# Patient Record
Sex: Female | Born: 1945 | Hispanic: No | Marital: Married | Smoking: Light tobacco smoker
Health system: Southern US, Community
[De-identification: ages and names within clinical notes are randomized; demographics above are authoritative.]

## PROBLEM LIST (undated history)

## (undated) DIAGNOSIS — Z5189 Encounter for other specified aftercare: Secondary | ICD-10-CM

## (undated) DIAGNOSIS — F99 Mental disorder, not otherwise specified: Secondary | ICD-10-CM

## (undated) DIAGNOSIS — I999 Unspecified disorder of circulatory system: Secondary | ICD-10-CM

## (undated) DIAGNOSIS — F419 Anxiety disorder, unspecified: Secondary | ICD-10-CM

## (undated) DIAGNOSIS — M1990 Unspecified osteoarthritis, unspecified site: Secondary | ICD-10-CM

## (undated) DIAGNOSIS — H269 Unspecified cataract: Secondary | ICD-10-CM

## (undated) DIAGNOSIS — J449 Chronic obstructive pulmonary disease, unspecified: Secondary | ICD-10-CM

## (undated) DIAGNOSIS — D689 Coagulation defect, unspecified: Secondary | ICD-10-CM

## (undated) HISTORY — DX: Encounter for other specified aftercare: Z51.89

## (undated) HISTORY — DX: Mental disorder, not otherwise specified: F99

## (undated) HISTORY — PX: ESOPHAGUS SURGERY: SHX626

## (undated) HISTORY — DX: Unspecified cataract: H26.9

## (undated) HISTORY — DX: Anxiety disorder, unspecified: F41.9

## (undated) HISTORY — DX: Chronic obstructive pulmonary disease, unspecified: J44.9

## (undated) HISTORY — DX: Unspecified osteoarthritis, unspecified site: M19.90

## (undated) HISTORY — PX: TUBAL LIGATION: SHX77

## (undated) HISTORY — PX: LEG WOUND REPAIR / CLOSURE: SUR1143

## (undated) HISTORY — DX: Coagulation defect, unspecified: D68.9

## (undated) HISTORY — DX: Unspecified disorder of circulatory system: I99.9

---

## 2012-07-11 ENCOUNTER — Ambulatory Visit (HOSPITAL_COMMUNITY): Payer: Self-pay | Admitting: Psychiatry

## 2012-11-11 ENCOUNTER — Ambulatory Visit (INDEPENDENT_AMBULATORY_CARE_PROVIDER_SITE_OTHER): Payer: Medicare Other

## 2012-11-11 ENCOUNTER — Other Ambulatory Visit: Payer: Self-pay | Admitting: Family Medicine

## 2012-11-11 DIAGNOSIS — M810 Age-related osteoporosis without current pathological fracture: Secondary | ICD-10-CM

## 2012-11-11 DIAGNOSIS — Z780 Asymptomatic menopausal state: Secondary | ICD-10-CM

## 2012-11-11 DIAGNOSIS — Z1231 Encounter for screening mammogram for malignant neoplasm of breast: Secondary | ICD-10-CM

## 2013-11-01 IMAGING — DX DG DXA BONE DENSITY STUDY HL7
2 series · 2 of 2 positions shown · non-contrast
Comparison: None

CLINICAL DATA: Post menopausal osteoporosis screening. The patient
is on current bone [HOSPITAL] with [facility].

[view not recorded (1 of 2)]
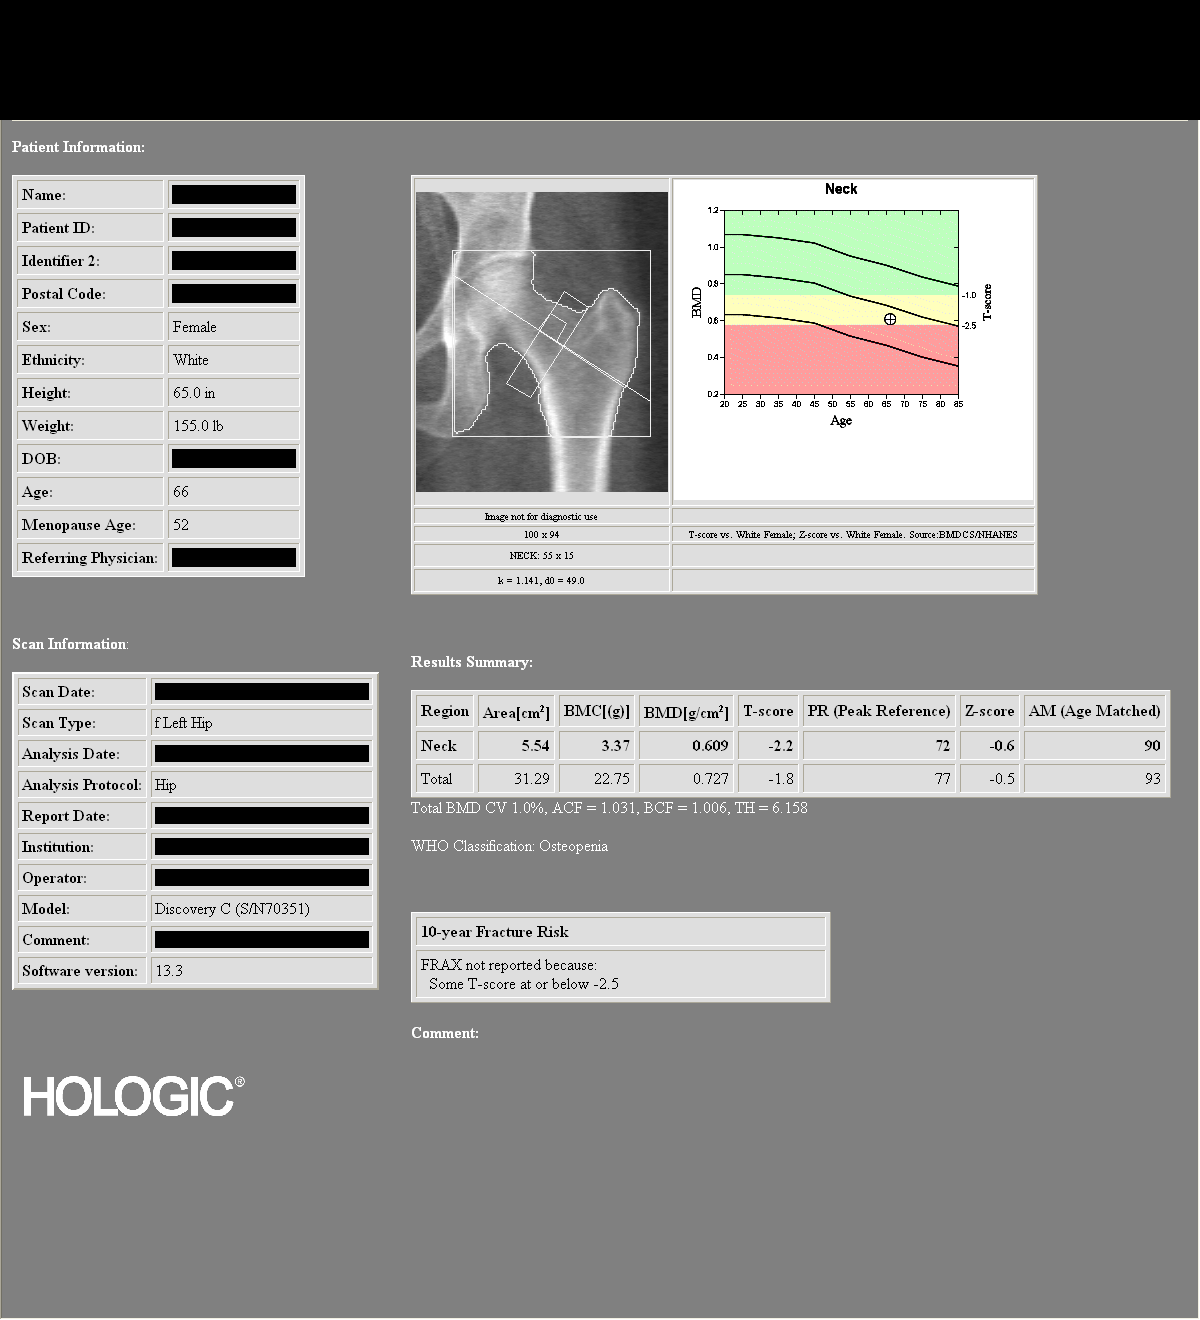

[view not recorded (2 of 2)]
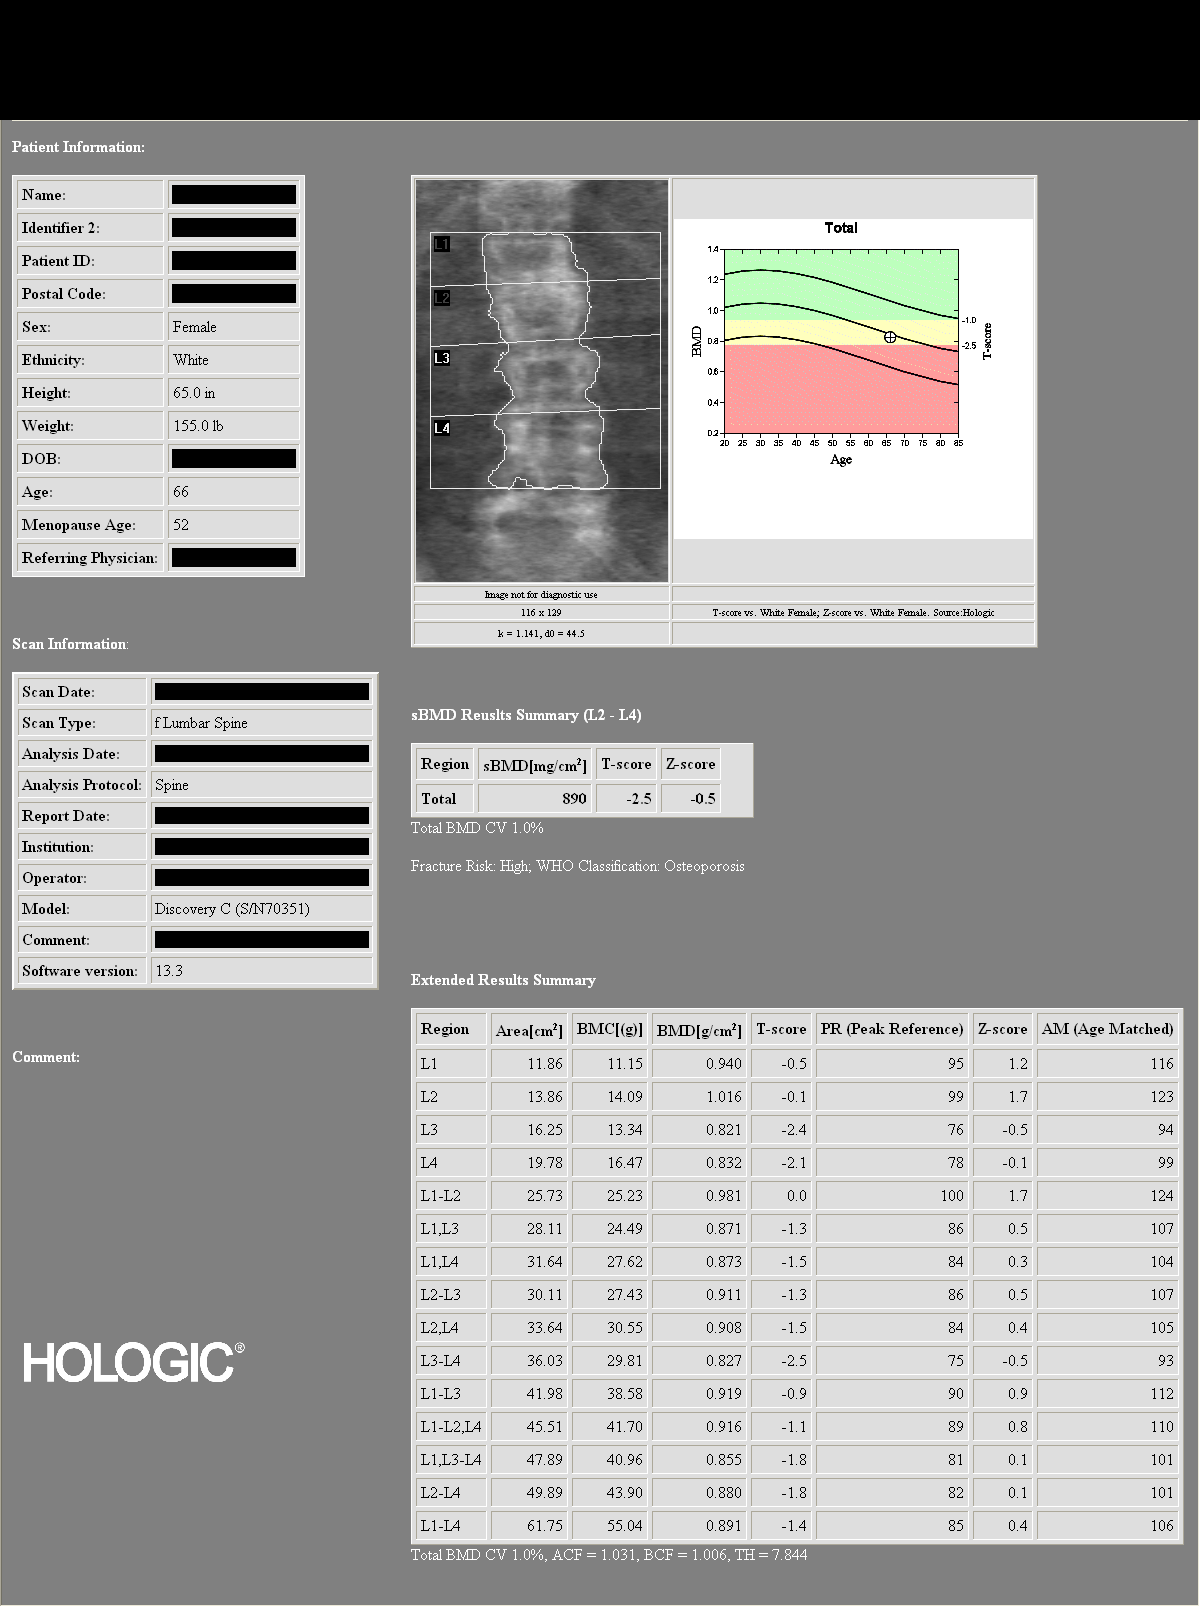

[2 of 2 positions shown; findings below may reference images not displayed]

DUAL X-RAY ABSORPTIOMETRY (DXA) FOR BONE MINERAL DENSITY

AP LUMBAR SPINE L3 AND L4

Bone Mineral Density (BMD):            0.827 g/cm2
Young Adult T Score:                          -2.5
Z Score:                                                -0.5

LEFT FEMUR NECK

Bone Mineral Density (BMD):             0.609 g/cm2
Young Adult T Score:                           -2.2
Z Score:                                                 -0.6

ASSESSMENT:  Patient's diagnostic category is OSTEOPOROSIS by WHO
Criteria.

FRACTURE RISK: INCREASED

FRAX: Not calculated due to T score at or below -2.5 as well as
current bone [HOSPITAL] with [facility].
RECOMMENDATIONS:

Effective therapies are available in the form of bisphosphonates,
selective estrogen receptor modulators, biologic agents, and
hormone replacement therapy (for women).  All patients should
ensure an adequate intake of dietary calcium (1200mg daily) and
vitamin D ([address]) unless contraindicated.

All treatment decisions require clinical judgement and
consideration of individual patient factors, including patient
preferences, co-morbidities, previous drug use, risk factors not
captured in the FRAX model (e.g., frailty, falls, vitamin D
deficiency, increased bone turnover, interval significant decline
in bone density) and possible under-or over-estimation of fracture
risk by FRAX.

The National Osteoporosis Foundation recommends that FDA-approved
medical therapies be considered in postmenopausal women and mean
age 50 or older with a:

      1)     Hip or vertebral (clinical or morphometric) fracture.

2)    T-score of -2.5 or lower at the spine or hip.
3)    Ten-year fracture probability by FRAX of 3% or greater for
hip fracture or 20% or greater for major osteoporotic fracture.
FOLLOW-UP:

People with diagnosed cases of osteoporosis or at high risk for
fracture should have regular bone mineral density tests.  For
patients eligible for Medicare, routine testing is allowed once
every 2 years.  The testing frequency can be increased to one year
for patients who have rapidly progressing disease, those who are
receiving or discontinuing medical therapy to restore bone mass, or
have additional risk factors.

World Health Organization (WHO) Criteria:

Normal: T scores from +1.0 to -1.0
Low Bone Mass (Osteopenia): T scores between -1.0 and -2.5
Osteoporosis: T scores -2.5 and below

Comparison to Reference Population:

T score is the key measure used in the diagnosis of osteoporosis
and relative risk determination for fracture.  It provides a value
for bone mass relative to the mean bone mass of a young adult
reference population expressed in terms of standard deviation (SD).

Z score is the age-matched score showing the patient's values
compared to a population matched for age, sex, and race.  This is
also expressed in terms of standard deviation.  The patient may
have values that compare favorably to the age-matched values and
still be at increased risk for fracture.

## 2013-11-30 ENCOUNTER — Ambulatory Visit (HOSPITAL_COMMUNITY): Payer: Self-pay | Admitting: Psychiatry

## 2014-01-15 ENCOUNTER — Ambulatory Visit (HOSPITAL_COMMUNITY): Payer: Self-pay | Admitting: Psychiatry

## 2016-08-06 ENCOUNTER — Telehealth (HOSPITAL_COMMUNITY): Payer: Self-pay

## 2016-08-06 NOTE — Telephone Encounter (Signed)
Called to schedule consult for compression fracture. Pt stated that she is already going to see Dr. Manson PasseyBrown for her back. She will give us a call if things change. AW

## 2017-10-15 NOTE — Progress Notes (Signed)
[facility] - Clinic Note  10/18/2017     CHIEF COMPLAINT Patient presents for Retina Evaluation   HISTORY OF PRESENT ILLNESS: Laura JarvisCarol Mun is a 71 y.o. female who presents to the clinic today for:   HPI    Retina Evaluation  In both eyes.  This started years ago.  Duration of hours.  Associated Symptoms Photophobia, Flashes and Pain.  Negative for Blind Spot, Floaters, Glare, Distortion, Redness, Trauma, Shoulder/Hip pain, Fatigue, Weight Loss, Jaw Claudication, Scalp Tenderness and Fever.  Context:  distance vision, night driving, mid-range vision and near vision.  Treatments tried include eye drops.  Response to treatment was no improvement.  I, the attending physician,  performed the HPI with the patient and updated documentation appropriately.        Comments  Referral of DR. Hecker Evaluation of possible fluid OU. Patient states she has always been sensitive to light even as a child  ou . She states when she drives at night she sees circles around objects OU .Her eyes feel tried and sore Ou.Dr. Elmer PickerHecker told patient she has cataracts OU."Right eye hurts and burns all the time " She uses Smooth XP 1 gtts BID,  Visine PRN, Systane 4-5 x QD. Vitamin C, D, Zinc,mulitvitamin B-12 .    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 (History)      Referring physician: Mateo FlowHecker, Kathryn, MD [address] [place], [place] [id number]  HISTORICAL INFORMATION:   Selected notes from the MEDICAL RECORD NUMBER Referral from Dr. Kirtland BouchardK. Hecker for concern of PED  OD; Ocular Hx- cataract OU; punct plugs OU (Dr. August LuzGarber, [phone]'s);  PMH- high chol.;    CURRENT MEDICATIONS: No current outpatient prescriptions on file. (Ophthalmic Drugs)   No current facility-administered medications for this visit.  (Ophthalmic Drugs)   Current Outpatient Prescriptions (Other)  Medication Sig  . albuterol (PROVENTIL HFA;VENTOLIN HFA) 108 (90 Base) MCG/ACT inhaler Inhale into the lungs.  Marland Kitchen. apixaban (ELIQUIS) 5 MG TABS tablet Take by mouth.  . bacitracin 500 UNIT/GM ointment Apply topically.  . baclofen (LIORESAL) 10 MG tablet Take by mouth.  . Biotin 2500 MCG CAPS Take by mouth.  . bumetanide (BUMEX) 2 MG tablet Take 2 mg by mouth 2 (two) times daily.  . bumetanide (BUMEX) 2 MG tablet TAKE 1 TABLET TWICE A DAY  . Calcium-Vitamin D-Vitamin K 500-100-40 MG-UNT-MCG CHEW Chew by mouth.  . clonazePAM (KLONOPIN) 1 MG tablet TAKE 1 TABLET TWICE DAILY AS NEEDED FOR ANXIETY  . cyanocobalamin (,VITAMIN B-12,) 1000 MCG/ML injection Inject as directed every 30 (thirty) days.  . diclofenac sodium (VOLTAREN) 1 % GEL APPLY 2 GRAMS TO AFFECTED AREA(S) TWICE DAILY AS NEEDED (2 GRAMS IS EQUAL TO A NICKEL SIZED AMOUNT)  . ELIQUIS 5 MG TABS tablet TAKE ONE TABLET (5 MG TOTAL) BY MOUTH 2 (TWO) TIMES DAILY.  Marland Kitchen. EPINEPHrine 0.3 mg/0.3 mL IJ SOAJ injection as needed.  Marland Kitchen. FLUoxetine (PROZAC) 20 MG capsule Take 60 mg by mouth.  .Marland Kitchen  fluticasone (FLONASE) 50 MCG/ACT nasal spray Place into the nose.  . fluticasone (FLONASE) 50 MCG/ACT nasal spray SPRAY 1 SPRAY INTO EACH NOSTRIL EVERY DAY  . fluticasone (VERAMYST) 27.5 MCG/SPRAY nasal spray Place into the nose.  . folic acid (FOLVITE) 1 MG tablet Take 1 mg by mouth daily.  . hydrOXYzine (ATARAX/VISTARIL) 25 MG tablet TAKE 1 TABLET (25 MG TOTAL) BY MOUTH 3 (THREE) TIMES DAILY AS NEEDED FOR UP TO 10 DAYS FOR ITCHING.  . morphine (MS CONTIN) 15 MG 12 hr tablet Take 15 mg by mouth 2 (two) times daily.  Marland Kitchen omeprazole (PRILOSEC) 40 MG capsule Take 40 mg by  mouth.  . oxyCODONE (OXY IR/ROXICODONE) 5 MG immediate release tablet Take 5 mg by mouth 4 (four) times daily.  Marland Kitchen oxycodone-acetaminophen (LYNOX) 10-300 MG tablet oxycodone  . polyethylene glycol (MIRALAX / GLYCOLAX) packet Take by mouth.  . potassium chloride (MICRO-K) 10 MEQ CR capsule TAKE 7 CAPSULES BY MOUTH EVERY DAY  . senna (SENOKOT) 8.6 MG tablet Take by mouth.  . SYRINGE-NEEDLE, DISP, 3 ML (B-D 3CC LUER-LOK SYR 23GX1") 23G X 1" 3 ML MISC USE FOR INJECTION ONCE WEEKLY.  . clindamycin (CLEOCIN) 300 MG capsule TAKE 1 CAPSULE BY MOUTH EVERY 6 HOURS  . promethazine-dextromethorphan (PROMETHAZINE-DM) 6.25-15 MG/5ML syrup Take 5 mLs by mouth.  . rosuvastatin (CRESTOR) 5 MG tablet   . warfarin (COUMADIN) 2.5 MG tablet TAKE AS DIRECTED (1 TABLET BY MOUTH 5 DAYS PER WEEK, AND 2 TABLETS 2 DAYS PER WEEK)   No current facility-administered medications for this visit.  (Other)      REVIEW OF SYSTEMS: ROS    Positive for: Skin, Genitourinary, Musculoskeletal, Cardiovascular, Eyes, Respiratory, Psychiatric, Heme/Lymph   Negative for: Constitutional, Gastrointestinal, Neurological, HENT, Endocrine, Allergic/Imm   Last edited by Murtis Sink on 10/18/2017  3:51 PM. (History)       ALLERGIES Allergies  Allergen Reactions  . Penicillins Anaphylaxis    PAST MEDICAL HISTORY Past Medical History:  Diagnosis Date  . Anxiety   . Arthritis   . Blood transfusion without reported diagnosis   . Cataract   . Clotting disorder (HCC)   . COPD (chronic obstructive pulmonary disease) (HCC)   . Osteoarthrosis   . Psychiatric disorder   . Vascular disease    Past Surgical History:  Procedure Laterality Date  . ESOPHAGUS SURGERY    . TUBAL LIGATION      FAMILY HISTORY Family History  Problem Relation Age of Onset  . Stroke Mother   . Cirrhosis Father   . Arthritis-Osteo Maternal Aunt     SOCIAL HISTORY Social History  Substance Use Topics  . Smoking status: Light Tobacco  Smoker  . Smokeless tobacco: Current User  . Alcohol use 1.2 oz/week    2 Cans of beer per week         OPHTHALMIC EXAM:  Base Eye Exam    Visual Acuity (Snellen - Linear)      Right Left   Dist cc 20/40 -2 20/50 +1   Dist ph cc 20/40 +1 20/30 -1   Correction:  Glasses       Tonometry (Tonopen, 3:22 PM)      Right Left   Pressure 16 15       Pupils      Dark Light Shape React APD   Right 7 5 Round 3 None   Left 7 5 Round 3 None       Visual Fields (Counting fingers)  Left Right    Full Full       Extraocular Movement      Right Left    Full, Ortho Full, Ortho       Neuro/Psych    Oriented x3:  Yes   Mood/Affect:  Normal       Dilation    Both eyes:  1.0% Mydriacyl, 2.5% Phenylephrine @ 3:22 PM        Slit Lamp and Fundus Exam    Slit Lamp Exam      Right Left   Lids/Lashes Mild Dermatochalasis - upper lid Mild Dermatochalasis - upper lid   Conjunctiva/Sclera White and quiet White and quiet   Cornea Inferior 2+ Punctate epithelial erosions, trace-1+ Guttata Inferior 3+ Punctate epithelial erosions   Anterior Chamber Deep and quiet Deep and quiet   Iris Round and dilated Round and dilated   Lens 2+ Nuclear sclerosis, 2+ Cortical cataract 2+ Nuclear sclerosis, 2+ Cortical cataract   Vitreous Mild Vitreous syneresis Vitreous syneresis       Fundus Exam      Right Left   Disc Normal Normal   C/D Ratio 0.3 0.3   Macula blunted foveal reflex, Retinal pigment epithelial mottling, no heme or edema Flat, Retinal pigment epithelial mottling, no heme or edema   Vessels Normal Normal   Periphery Pigmented Chorioretinal scar at 0900, otherwise attached, No SRF, No detachment PIgment clumping at 0430        Refraction    Wearing Rx      Sphere Cylinder Axis Add   Right [place] +1.00 170 +2.50   Left -0.25 +1.00 180 +2.50   Age:  2   Type:  PAL       Manifest Refraction (Over)      Sphere Cylinder Axis Dist VA   Right +1.00 +0.75 155 20/30   Left  [place] +0.50 008 20/30-1          IMAGING AND PROCEDURES  Imaging and Procedures for 10/19/17  OCT, Retina - OU - Both Eyes     Right Eye Quality was good. Central Foveal Thickness: 339. Progression has no prior data. Findings include abnormal foveal contour, no SRF (Intra-foveal cystic spaces; foveoschisis of OPL).   Left Eye Quality was good. Central Foveal Thickness: 277. Progression has no prior data. Findings include normal foveal contour, no IRF, no SRF.   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 CME - cystoid macular edema. PED - pigment epithelial detachment. IRF - intraretinal fluid. SRF - subretinal fluid. EZ - ellipsoid zone. ERM - epiretinal membrane. ORA - outer retinal atrophy. ORT - outer retinal tubulation. SRHM - subretinal hyper-reflective material       Fluorescein Angiography [place] (Transit OD)     Right Eye Early phase findings include normal observations. Mid/Late phase findings include normal observations.   Left Eye Early phase findings include normal observations. Mid/Late phase findings include normal observations.   Notes OD: no petaloid hyperfluorescence corresponding to cystic changes noted on OCT              ASSESSMENT/PLAN:    ICD-10-CM   1. 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  Combined forms of age-related cataract of both eyes H25.813   4. 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 Retinal edema OD  - cystic changes on OCT appear more like foveoschisis in the outer plexiform layer, typically seen in myopic degeneration, however pt is not myopic; also no VMA or VMT on OCT to indicate tractional  etiology  - on FA -- no leakage into macula corresponding to cystic changes, less suggestive of an inflammatory CME  - VA appears to be minimally affected by cystic changes and no treatment indicated at this time  - no contraindication to cataract extraction OD from a retina standpoint  - will monitor for now  - f/u in 6 weeks  2. CR scar OD  - heavily pigmented linear scar -- possibly healed old retinal break  - asymptomatic, no SRF  - No RT or RD on 360 scleral depressed exam  - Reviewed s/s of RT/RD  - Strict return precautions for any such RT/RD signs/symptoms  - monitor for now -- consider laser retinopexy reinforcement if becomes asymptomatic or changes  3. Age-related cataracts OU  - The symptoms of cataract, surgical options, and treatments and risks were discussed with patient.  - discussed diagnosis and progression  - under the expert care of Dr. Elmer Picker  - clear to proceed with cataract surgery OU from a retinal standpoint  4. DES OU  - recommend artificial tears and lubricating ointment as needed  - management per Dr. Elmer Picker    Ophthalmic Meds Ordered this visit:  No orders of the defined types were placed in this encounter.      Return in about 6 weeks (around 11/29/2017) for Dilated Exam, OCT.  There are no Patient Instructions on file for this visit.   Explained the diagnoses, plan, and follow up with the patient and they expressed understanding.  Patient expressed understanding of the importance of proper follow up care.   Karie Chimera, M.D., Ph.D. Diseases & Surgery of the Retina and Vitreous [facility] 10/19/17     Abbreviations: M myopia (nearsighted); A astigmatism; H hyperopia (farsighted); P presbyopia; Mrx spectacle prescription;  CTL contact lenses; OD right eye; OS left eye; OU both eyes  XT exotropia; ET esotropia; PEK punctate epithelial keratitis; PEE punctate epithelial erosions; DES dry eye syndrome; MGD  meibomian gland dysfunction; ATs artificial tears; PFAT's preservative free artificial tears; NSC nuclear sclerotic cataract; PSC posterior subcapsular cataract; ERM epi-retinal membrane; PVD posterior vitreous detachment; RD retinal detachment; DM diabetes mellitus; DR diabetic retinopathy; NPDR non-proliferative diabetic retinopathy; PDR proliferative diabetic retinopathy; CSME clinically significant macular edema; DME diabetic macular edema; dbh dot blot hemorrhages; CWS cotton wool spot; POAG primary open angle glaucoma; C/D cup-to-disc ratio; HVF humphrey visual field; GVF goldmann visual field; OCT optical coherence tomography; IOP intraocular pressure; BRVO Branch retinal vein occlusion; CRVO central retinal vein occlusion; CRAO central retinal artery occlusion; BRAO branch retinal artery occlusion; RT retinal tear; SB scleral buckle; PPV pars plana vitrectomy; VH Vitreous hemorrhage; PRP panretinal laser photocoagulation; IVK intravitreal kenalog; VMT vitreomacular traction; MH Macular hole;  NVD neovascularization of the disc; NVE neovascularization elsewhere; AREDS age related eye disease study; ARMD age related macular degeneration; POAG primary open angle glaucoma; EBMD epithelial/anterior basement membrane dystrophy; ACIOL anterior chamber intraocular lens; IOL intraocular lens; PCIOL posterior chamber intraocular lens; Phaco/IOL phacoemulsification with intraocular lens placement; PRK photorefractive keratectomy; LASIK laser assisted in situ keratomileusis; HTN hypertension; DM diabetes mellitus; COPD chronic obstructive  pulmonary disease

## 2017-10-18 ENCOUNTER — Encounter (INDEPENDENT_AMBULATORY_CARE_PROVIDER_SITE_OTHER): Payer: Self-pay | Admitting: Ophthalmology

## 2017-10-18 ENCOUNTER — Ambulatory Visit (INDEPENDENT_AMBULATORY_CARE_PROVIDER_SITE_OTHER): Payer: Medicare Other | Admitting: Ophthalmology

## 2017-10-18 DIAGNOSIS — H3581 Retinal edema: Secondary | ICD-10-CM | POA: Diagnosis not present

## 2017-10-18 DIAGNOSIS — H04123 Dry eye syndrome of bilateral lacrimal glands: Secondary | ICD-10-CM

## 2017-10-18 DIAGNOSIS — H25813 Combined forms of age-related cataract, bilateral: Secondary | ICD-10-CM | POA: Diagnosis not present

## 2017-10-18 DIAGNOSIS — H31001 Unspecified chorioretinal scars, right eye: Secondary | ICD-10-CM | POA: Diagnosis not present

## 2017-11-29 ENCOUNTER — Encounter (INDEPENDENT_AMBULATORY_CARE_PROVIDER_SITE_OTHER): Payer: Medicare Other | Admitting: Ophthalmology

## 2017-12-01 NOTE — Progress Notes (Signed)
[facility] - Clinic Note  12/03/2017     CHIEF COMPLAINT Patient presents for Retina Follow Up   HISTORY OF PRESENT ILLNESS: Laura JarvisCarol Phelps is a 71 y.o. female who presents to the clinic today for:   HPI    Retina Follow Up    In both eyes.  This started 40 years ago.  Severity is mild.  Since onset it is gradually worsening.  I, the attending physician,  performed the HPI with the patient and updated documentation appropriately.          Comments    F/U Retinal edema .Patient states vision  is worse" I can not see in either eye". Pt reports flashes faded away after Ov 2 months ago . Pt reports she continues to have light sensitivity Ou and right eye burns sometimes. Pt uses Visine ou Prn, Southe XP OU Prn,  Systane OU Prn, Artifical tears Ou Prn ,Takes B 12 injections every month . Pt states she fell last month resulting in laceration to her left lower leg which resulted in  stiches . Her Cat. Sx has been rescheduled to next year.         Last edited by Eldridge ScotKendrick, Glenda, LPN on 16/10/960412/14/2018  3:25 PM. (History)      Referring physician: No referring provider defined for this encounter.  HISTORICAL INFORMATION:   Selected notes from the MEDICAL RECORD NUMBER Referral from Dr. Kirtland BouchardK. Phelps for concern of PED OD; Ocular Hx- cataract OU; punct plugs OU (Dr. August Phelps, [phone]'s);  PMH- high chol.;    CURRENT MEDICATIONS: No current outpatient medications on file. (Ophthalmic Drugs)   No current facility-administered medications for this visit.  (Ophthalmic Drugs)   Current Outpatient Medications (Other)  Medication Sig  . albuterol (PROVENTIL HFA;VENTOLIN HFA) 108 (90 Base) MCG/ACT inhaler Inhale into the lungs.  Marland Kitchen. apixaban (ELIQUIS) 5 MG TABS tablet Take by mouth.  . bacitracin 500 UNIT/GM ointment Apply topically.  . baclofen (LIORESAL) 10 MG tablet Take by mouth.  . Biotin 2500 MCG CAPS Take by mouth.  . bumetanide (BUMEX) 2 MG tablet Take 2 mg by mouth 2 (two)  times daily.  . bumetanide (BUMEX) 2 MG tablet TAKE 1 TABLET TWICE A DAY  . Calcium-Vitamin D-Vitamin K 500-100-40 MG-UNT-MCG CHEW Chew by mouth.  . clindamycin (CLEOCIN) 300 MG capsule TAKE 1 CAPSULE BY MOUTH EVERY 6 HOURS  . clonazePAM (KLONOPIN) 1 MG tablet TAKE 1 TABLET TWICE DAILY AS NEEDED FOR ANXIETY  . cyanocobalamin (,VITAMIN B-12,) 1000 MCG/ML injection Inject as directed every 30 (thirty) days.  . diclofenac sodium (VOLTAREN) 1 % GEL APPLY 2 GRAMS TO AFFECTED AREA(S) TWICE DAILY AS NEEDED (2 GRAMS IS EQUAL TO A NICKEL SIZED AMOUNT)  . ELIQUIS 5 MG TABS tablet TAKE ONE TABLET (5 MG TOTAL) BY MOUTH 2 (TWO) TIMES DAILY.  Marland Kitchen. EPINEPHrine 0.3 mg/0.3 mL IJ SOAJ injection as needed.  Marland Kitchen. FLUoxetine (PROZAC) 20 MG capsule Take 60 mg by mouth.  . fluticasone (FLONASE) 50 MCG/ACT nasal spray Place into the nose.  . fluticasone (FLONASE) 50 MCG/ACT nasal spray SPRAY 1 SPRAY INTO EACH NOSTRIL EVERY DAY  . fluticasone (VERAMYST) 27.5 MCG/SPRAY nasal spray Place into the nose.  . folic acid (FOLVITE) 1 MG tablet Take 1 mg by mouth daily.  . hydrOXYzine (ATARAX/VISTARIL) 25 MG tablet TAKE 1 TABLET (25 MG TOTAL) BY MOUTH 3 (THREE) TIMES DAILY AS NEEDED FOR UP TO 10 DAYS FOR ITCHING.  . morphine (MS CONTIN) 15 MG  12 hr tablet Take 15 mg by mouth 2 (two) times daily.  Marland Kitchen omeprazole (PRILOSEC) 40 MG capsule Take 40 mg by mouth.  . oxyCODONE (OXY IR/ROXICODONE) 5 MG immediate release tablet Take 5 mg by mouth 4 (four) times daily.  Marland Kitchen oxycodone-acetaminophen (LYNOX) 10-300 MG tablet oxycodone  . polyethylene glycol (MIRALAX / GLYCOLAX) packet Take by mouth.  . potassium chloride (MICRO-K) 10 MEQ CR capsule TAKE 7 CAPSULES BY MOUTH EVERY DAY  . promethazine-dextromethorphan (PROMETHAZINE-DM) 6.25-15 MG/5ML syrup Take 5 mLs by mouth.  . rosuvastatin (CRESTOR) 5 MG tablet   . senna (SENOKOT) 8.6 MG tablet Take by mouth.  . SYRINGE-NEEDLE, DISP, 3 ML (B-D 3CC LUER-LOK SYR 23GX1") 23G X 1" 3 ML MISC USE FOR  INJECTION ONCE WEEKLY.  . warfarin (COUMADIN) 2.5 MG tablet TAKE AS DIRECTED (1 TABLET BY MOUTH 5 DAYS PER WEEK, AND 2 TABLETS 2 DAYS PER WEEK)   No current facility-administered medications for this visit.  (Other)      REVIEW OF SYSTEMS: ROS    Positive for: Eyes   Negative for: Constitutional, Gastrointestinal, Neurological, Skin, Genitourinary, Musculoskeletal, HENT, Endocrine, Cardiovascular, Respiratory, Psychiatric, Allergic/Imm, Heme/Lymph   Last edited by Eldridge Scot, LPN on 16/09/9603  1:09 PM. (History)       ALLERGIES Allergies  Allergen Reactions  . Penicillins Anaphylaxis    PAST MEDICAL HISTORY Past Medical History:  Diagnosis Date  . Anxiety   . Arthritis   . Blood transfusion without reported diagnosis   . Cataract   . Clotting disorder (HCC)   . COPD (chronic obstructive pulmonary disease) (HCC)   . Osteoarthrosis   . Psychiatric disorder   . Vascular disease    Past Surgical History:  Procedure Laterality Date  . ESOPHAGUS SURGERY    . LEG WOUND REPAIR / CLOSURE     fell 5 weeks ago 11/18   . TUBAL LIGATION      FAMILY HISTORY Family History  Problem Relation Age of Onset  . Stroke Mother   . Cirrhosis Father   . Arthritis-Osteo Maternal Aunt     SOCIAL HISTORY Social History   Tobacco Use  . Smoking status: Light Tobacco Smoker  . Smokeless tobacco: Current User  Substance Use Topics  . Alcohol use: Yes    Alcohol/week: 1.2 oz    Types: 2 Cans of beer per week  . Drug use: Not on file         OPHTHALMIC EXAM:  Base Eye Exam    Visual Acuity (Snellen - Linear)      Right Left   Dist cc 20/40 +2 20/30 -2   Dist ph cc NI 20/30 +2   Correction:  Glasses       Tonometry (Tonopen, 2:15 PM)      Right Left   Pressure 18 17       Pupils      Dark Light Shape React APD   Right 6 5 Round 2 None   Left 6 5 Round 2 None       Visual Fields      Left Right    Full Full       Extraocular Movement      Right Left     Full, Ortho Full, Ortho       Neuro/Psych    Oriented x3:  Yes   Mood/Affect:  Normal       Dilation    Both eyes:  1.0% Mydriacyl, 2.5% Phenylephrine @ 2:15 PM  Slit Lamp and Fundus Exam    Slit Lamp Exam      Right Left   Lids/Lashes Mild Dermatochalasis - upper lid Mild Dermatochalasis - upper lid   Conjunctiva/Sclera White and quiet White and quiet   Cornea Inferior 2+ Punctate epithelial erosions inferiorly, trace-1+ Guttata, Debris in tear film Inferior 3+ Punctate epithelial erosions   Anterior Chamber Deep and quiet Deep and quiet   Iris Round and dilated Round and dilated   Lens 2+ Nuclear sclerosis, 2+ Cortical cataract 2+ Nuclear sclerosis, 2+ Cortical cataract   Vitreous Mild Vitreous syneresis Vitreous syneresis       Fundus Exam      Right Left   Disc Normal Normal   C/D Ratio 0.3 0.3   Macula blunted foveal reflex, Retinal pigment epithelial mottling, no heme or edema Flat, Retinal pigment epithelial mottling, no heme or edema   Vessels Normal Normal   Periphery Pigmented Chorioretinal scar at 0900, otherwise attached, No SRF, No detachment, mild Reticular degeneration and pigment clumping Pigment clumping at 0430, mild Reticular degeneration and pigment clumping        Refraction    Wearing Rx      Sphere Cylinder Axis Add   Right [place] +1.00 170 +2.50   Left -0.25 +1.00 180 +2.50   Type:  PAL       Manifest Refraction (Over)      Sphere Cylinder Axis Dist VA   Right +0.50 +1.00 170 20/25-2   Left +0.25 +1.00 177 20/25-1          IMAGING AND PROCEDURES  Imaging and Procedures for 12/03/17  OCT, Retina - OU - Both Eyes     Right Eye Quality was good. Central Foveal Thickness: 332. Progression has been stable. Findings include abnormal foveal contour, no SRF (Intra-foveal cystic spaces; foveoschisis of OPL).   Left Eye Quality was good. Central Foveal Thickness: 274. Progression has been stable. Findings include normal foveal  contour, no IRF, no SRF.   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 CME - cystoid macular edema. PED - pigment epithelial detachment. IRF - intraretinal fluid. SRF - subretinal fluid. EZ - ellipsoid zone. ERM - epiretinal membrane. ORA - outer retinal atrophy. ORT - outer retinal tubulation. SRHM - subretinal hyper-reflective material                  ASSESSMENT/PLAN:    ICD-10-CM   1. 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 Combined forms of age-related cataract of both eyes H25.813   5. Dry eye syndrome of both eyes H04.123     1,2. Retinal edema / foveoschisis OD --stable  - cystic changes on OCT appear more like foveoschisis in the outer plexiform layer, typically seen in myopic degeneration, however pt is not myopic; also no VMA or VMT on OCT to indicate tractional etiology  - today, no significant changes on OCT or w/ VA  - on prior FA -- no leakage into macula corresponding to cystic changes, less suggestive of an inflammatory CME  - VA appears to be minimally affected by cystic changes and no treatment indicated at this time  -  no contraindication to cataract extraction OD from a retina standpoint  - will continue to monitor  - f/u in 2-3 months  2. CR scar OD  - heavily pigmented linear scar -- possibly healed old retinal break  - asymptomatic, no SRF  - No RT or RD on 360 scleral depressed exam  - Reviewed s/s of RT/RD  - Strict return precautions for any such RT/RD signs/symptoms  - monitor for now -- consider laser retinopexy reinforcement if becomes symptomatic or changes develop  3. Age-related cataracts OU  - The symptoms  of cataract, surgical options, and treatments and risks were discussed with patient.  - discussed diagnosis and progression  - under the expert care of Dr. Elmer Picker  - clear to proceed with cataract surgery OU from a retinal standpoint  4. DES OU  - recommend artificial tears and lubricating ointment as needed  - management per Dr. Elmer Picker    Ophthalmic Meds Ordered this visit:  No orders of the defined types were placed in this encounter.      Return in about 2 months (around 02/03/2018) for F/U retinal edema OD, CR scar OD.  There are no Patient Instructions on file for this visit.   Explained the diagnoses, plan, and follow up with the patient and they expressed understanding.  Patient expressed understanding of the importance of proper follow up care.   This document serves as a record of services personally performed by Karie Chimera, MD, PhD. It was created on their behalf by Virgilio Belling, COA, a certified ophthalmic assistant. The creation of this record is the provider's dictation and/or activities during the visit.  Electronically signed by: Virgilio Belling, COA  12/03/17 3:33 PM    Karie Chimera, M.D., Ph.D. Diseases & Surgery of the Retina and Vitreous [facility] 12/03/17   I have reviewed the above documentation for accuracy and completeness, and I agree with the above. Karie Chimera, M.D., Ph.D. 12/03/17 3:35 PM    Abbreviations: M myopia (nearsighted); A astigmatism; H hyperopia (farsighted); P presbyopia; Mrx spectacle prescription;  CTL contact lenses; OD right eye; OS left eye; OU both eyes  XT exotropia; ET esotropia; PEK punctate epithelial keratitis; PEE punctate epithelial erosions; DES dry eye syndrome; MGD meibomian gland dysfunction; ATs artificial tears; PFAT's preservative free artificial tears; NSC nuclear sclerotic cataract; PSC posterior subcapsular cataract; ERM epi-retinal membrane; PVD posterior vitreous detachment;  RD retinal detachment; DM diabetes mellitus; DR diabetic retinopathy; NPDR non-proliferative diabetic retinopathy; PDR proliferative diabetic retinopathy; CSME clinically significant macular edema; DME diabetic macular edema; dbh dot blot hemorrhages; CWS cotton wool spot; POAG primary open angle glaucoma; C/D cup-to-disc ratio; HVF humphrey visual field; GVF goldmann visual field; OCT optical coherence tomography; IOP intraocular pressure; BRVO Branch retinal vein occlusion; CRVO central retinal vein occlusion; CRAO central retinal artery occlusion; BRAO branch retinal artery occlusion; RT retinal tear; SB scleral buckle; PPV pars plana vitrectomy; VH Vitreous hemorrhage; PRP panretinal laser photocoagulation; IVK intravitreal kenalog; VMT vitreomacular traction; MH Macular hole;  NVD neovascularization of the disc; NVE neovascularization elsewhere; AREDS age related eye disease study; ARMD age related macular degeneration; POAG primary open angle glaucoma; EBMD epithelial/anterior basement membrane dystrophy; ACIOL anterior chamber intraocular lens; IOL intraocular lens; PCIOL posterior chamber intraocular lens; Phaco/IOL phacoemulsification with intraocular lens placement; PRK photorefractive keratectomy; LASIK laser assisted in situ keratomileusis; HTN hypertension; DM diabetes mellitus; COPD chronic obstructive pulmonary disease

## 2017-12-03 ENCOUNTER — Ambulatory Visit (INDEPENDENT_AMBULATORY_CARE_PROVIDER_SITE_OTHER): Payer: Medicare Other | Admitting: Ophthalmology

## 2017-12-03 ENCOUNTER — Encounter (INDEPENDENT_AMBULATORY_CARE_PROVIDER_SITE_OTHER): Payer: Self-pay | Admitting: Ophthalmology

## 2017-12-03 DIAGNOSIS — H04123 Dry eye syndrome of bilateral lacrimal glands: Secondary | ICD-10-CM | POA: Diagnosis not present

## 2017-12-03 DIAGNOSIS — H3581 Retinal edema: Secondary | ICD-10-CM

## 2017-12-03 DIAGNOSIS — H25813 Combined forms of age-related cataract, bilateral: Secondary | ICD-10-CM | POA: Diagnosis not present

## 2017-12-03 DIAGNOSIS — H33191 Other retinoschisis and retinal cysts, right eye: Secondary | ICD-10-CM

## 2017-12-03 DIAGNOSIS — H31001 Unspecified chorioretinal scars, right eye: Secondary | ICD-10-CM

## 2018-02-11 NOTE — Progress Notes (Deleted)
[facility] - Clinic Note  02/16/2018     CHIEF COMPLAINT Patient presents for No chief complaint on file.   HISTORY OF PRESENT ILLNESS: Laura JarvisCarol Gionfriddo is a 72 y.o. female who presents to the clinic today for:     Referring physician: No referring provider defined for this encounter.  HISTORICAL INFORMATION:   Selected notes from the MEDICAL RECORD NUMBER Referral from Dr. Kirtland BouchardK. Hecker for concern of PED OD; Ocular Hx- cataract OU; punct plugs OU (Dr. August Phelps, [phone]'s);  PMH- high chol.;    CURRENT MEDICATIONS: No current outpatient medications on file. (Ophthalmic Drugs)   No current facility-administered medications for this visit.  (Ophthalmic Drugs)   Current Outpatient Medications (Other)  Medication Sig   albuterol (PROVENTIL HFA;VENTOLIN HFA) 108 (90 Base) MCG/ACT inhaler Inhale into the lungs.   apixaban (ELIQUIS) 5 MG TABS tablet Take by mouth.   bacitracin 500 UNIT/GM ointment Apply topically.   baclofen (LIORESAL) 10 MG tablet Take by mouth.   Biotin 2500 MCG CAPS Take by mouth.   bumetanide (BUMEX) 2 MG tablet Take 2 mg by mouth 2 (two) times daily.   bumetanide (BUMEX) 2 MG tablet TAKE 1 TABLET TWICE A DAY   Calcium-Vitamin D-Vitamin K 500-100-40 MG-UNT-MCG CHEW Chew by mouth.   clindamycin (CLEOCIN) 300 MG capsule TAKE 1 CAPSULE BY MOUTH EVERY 6 HOURS   clonazePAM (KLONOPIN) 1 MG tablet TAKE 1 TABLET TWICE DAILY AS NEEDED FOR ANXIETY   cyanocobalamin (,VITAMIN B-12,) 1000 MCG/ML injection Inject as directed every 30 (thirty) days.   diclofenac sodium (VOLTAREN) 1 % GEL APPLY 2 GRAMS TO AFFECTED AREA(S) TWICE DAILY AS NEEDED (2 GRAMS IS EQUAL TO A NICKEL SIZED AMOUNT)   ELIQUIS 5 MG TABS tablet TAKE ONE TABLET (5 MG TOTAL) BY MOUTH 2 (TWO) TIMES DAILY.   EPINEPHrine 0.3 mg/0.3 mL IJ SOAJ injection as needed.   FLUoxetine (PROZAC) 20 MG capsule Take 60 mg by mouth.   fluticasone (FLONASE) 50 MCG/ACT nasal spray Place into the nose.    fluticasone (FLONASE) 50 MCG/ACT nasal spray SPRAY 1 SPRAY INTO EACH NOSTRIL EVERY DAY   fluticasone (VERAMYST) 27.5 MCG/SPRAY nasal spray Place into the nose.   folic acid (FOLVITE) 1 MG tablet Take 1 mg by mouth daily.   hydrOXYzine (ATARAX/VISTARIL) 25 MG tablet TAKE 1 TABLET (25 MG TOTAL) BY MOUTH 3 (THREE) TIMES DAILY AS NEEDED FOR UP TO 10 DAYS FOR ITCHING.   morphine (MS CONTIN) 15 MG 12 hr tablet Take 15 mg by mouth 2 (two) times daily.   omeprazole (PRILOSEC) 40 MG capsule Take 40 mg by mouth.   oxyCODONE (OXY IR/ROXICODONE) 5 MG immediate release tablet Take 5 mg by mouth 4 (four) times daily.   oxycodone-acetaminophen (LYNOX) 10-300 MG tablet oxycodone   polyethylene glycol (MIRALAX / GLYCOLAX) packet Take by mouth.   potassium chloride (MICRO-K) 10 MEQ CR capsule TAKE 7 CAPSULES BY MOUTH EVERY DAY   promethazine-dextromethorphan (PROMETHAZINE-DM) 6.25-15 MG/5ML syrup Take 5 mLs by mouth.   rosuvastatin (CRESTOR) 5 MG tablet    senna (SENOKOT) 8.6 MG tablet Take by mouth.   SYRINGE-NEEDLE, DISP, 3 ML (B-D 3CC LUER-LOK SYR 23GX1") 23G X 1" 3 ML MISC USE FOR INJECTION ONCE WEEKLY.   warfarin (COUMADIN) 2.5 MG tablet TAKE AS DIRECTED (1 TABLET BY MOUTH 5 DAYS PER WEEK, AND 2 TABLETS 2 DAYS PER WEEK)   No current facility-administered medications for this visit.  (Other)      REVIEW OF SYSTEMS:  ALLERGIES Allergies  Allergen Reactions   Penicillins Anaphylaxis    PAST MEDICAL HISTORY Past Medical History:  Diagnosis Date   Anxiety    Arthritis    Blood transfusion without reported diagnosis    Cataract    Clotting disorder (HCC)    COPD (chronic obstructive pulmonary disease) (HCC)    Osteoarthrosis    Psychiatric disorder    Vascular disease    Past Surgical History:  Procedure Laterality Date   ESOPHAGUS SURGERY     LEG WOUND REPAIR / CLOSURE     fell 5 weeks ago 11/18    TUBAL LIGATION      FAMILY HISTORY Family History   Problem Relation Age of Onset   Stroke Mother    Cirrhosis Father    Arthritis-Osteo Maternal Aunt     SOCIAL HISTORY Social History   Tobacco Use   Smoking status: Light Tobacco Smoker   Smokeless tobacco: Current User  Substance Use Topics   Alcohol use: Yes    Alcohol/week: 1.2 oz    Types: 2 Cans of beer per week   Drug use: Not on file         OPHTHALMIC EXAM:   Not recorded      IMAGING AND PROCEDURES  Imaging and Procedures for 02/11/18           ASSESSMENT/PLAN:    ICD-10-CM   1. 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 Combined forms of age-related cataract of both eyes H25.813   5. Dry eye syndrome of both eyes H04.123     1,2. Retinal edema / foveoschisis OD --stable  - cystic changes on OCT appear more like foveoschisis in the outer plexiform layer, typically seen in myopic degeneration, however pt is not myopic; also no VMA or VMT on OCT to indicate tractional etiology  - today, no significant changes on OCT or w/ VA  - on prior FA -- no leakage into macula corresponding to cystic changes, less suggestive of an inflammatory CME  - VA appears to be minimally affected by cystic changes and no treatment indicated at this time  - no contraindication to cataract extraction OD from a retina standpoint  - will continue to monitor  - f/u in 2-3 months  2. CR scar OD  - heavily pigmented linear scar -- possibly healed old retinal break  - asymptomatic, no SRF  - No RT or RD on 360 scleral depressed exam  - Reviewed s/s of RT/RD  - Strict return precautions for any such RT/RD signs/symptoms  - monitor for now -- consider laser retinopexy reinforcement if becomes symptomatic or changes develop  3. Age-related cataracts OU  - The symptoms of cataract, surgical options, and treatments and risks were discussed with patient.  -  discussed diagnosis and progression  - under the expert care of Dr. Elmer Picker  - clear to proceed with cataract surgery OU from a retinal standpoint  4. DES OU  - recommend artificial tears and lubricating ointment as needed  - management per Dr. Elmer Picker    Ophthalmic Meds Ordered this visit:  No orders of the defined types were placed in this encounter.      No Follow-up on file.  There are no Patient Instructions on file for this visit.   Explained the diagnoses, plan, and follow up with the patient and they  expressed understanding.  Patient expressed understanding of the importance of proper follow up care.   This document serves as a record of services personally performed by Karie Chimera, MD, PhD. It was created on their behalf by Virgilio Belling, COA, a certified ophthalmic assistant. The creation of this record is the provider's dictation and/or activities during the visit.  Electronically signed by: Virgilio Belling, COA  02/11/18 12:17 PM    Karie Chimera, M.D., Ph.D. Diseases & Surgery of the Retina and Vitreous [facility] 02/11/18     Abbreviations: M myopia (nearsighted); A astigmatism; H hyperopia (farsighted); P presbyopia; Mrx spectacle prescription;  CTL contact lenses; OD right eye; OS left eye; OU both eyes  XT exotropia; ET esotropia; PEK punctate epithelial keratitis; PEE punctate epithelial erosions; DES dry eye syndrome; MGD meibomian gland dysfunction; ATs artificial tears; PFAT's preservative free artificial tears; NSC nuclear sclerotic cataract; PSC posterior subcapsular cataract; ERM epi-retinal membrane; PVD posterior vitreous detachment; RD retinal detachment; DM diabetes mellitus; DR diabetic retinopathy; NPDR non-proliferative diabetic retinopathy; PDR proliferative diabetic retinopathy; CSME clinically significant macular edema; DME diabetic macular edema; dbh dot blot hemorrhages; CWS cotton wool spot; POAG primary open  angle glaucoma; C/D cup-to-disc ratio; HVF humphrey visual field; GVF goldmann visual field; OCT optical coherence tomography; IOP intraocular pressure; BRVO Branch retinal vein occlusion; CRVO central retinal vein occlusion; CRAO central retinal artery occlusion; BRAO branch retinal artery occlusion; RT retinal tear; SB scleral buckle; PPV pars plana vitrectomy; VH Vitreous hemorrhage; PRP panretinal laser photocoagulation; IVK intravitreal kenalog; VMT vitreomacular traction; MH Macular hole;  NVD neovascularization of the disc; NVE neovascularization elsewhere; AREDS age related eye disease study; ARMD age related macular degeneration; POAG primary open angle glaucoma; EBMD epithelial/anterior basement membrane dystrophy; ACIOL anterior chamber intraocular lens; IOL intraocular lens; PCIOL posterior chamber intraocular lens; Phaco/IOL phacoemulsification with intraocular lens placement; PRK photorefractive keratectomy; LASIK laser assisted in situ keratomileusis; HTN hypertension; DM diabetes mellitus; COPD chronic obstructive pulmonary disease

## 2018-02-16 ENCOUNTER — Encounter (INDEPENDENT_AMBULATORY_CARE_PROVIDER_SITE_OTHER): Payer: Medicare Other | Admitting: Ophthalmology
